# Patient Record
Sex: Male | Born: 1960 | Race: Black or African American | Hispanic: No | Marital: Single | Smoking: Never smoker
Health system: Southern US, Community
[De-identification: ages and names within clinical notes are randomized; demographics above are authoritative.]

## PROBLEM LIST (undated history)

## (undated) DIAGNOSIS — E119 Type 2 diabetes mellitus without complications: Secondary | ICD-10-CM

## (undated) DIAGNOSIS — I10 Essential (primary) hypertension: Secondary | ICD-10-CM

## (undated) DIAGNOSIS — F419 Anxiety disorder, unspecified: Secondary | ICD-10-CM

## (undated) HISTORY — PX: NO PAST SURGERIES: SHX2092

---

## 2018-12-08 ENCOUNTER — Ambulatory Visit: Payer: Self-pay | Admitting: Orthopedic Surgery

## 2018-12-14 NOTE — Progress Notes (Signed)
No Pharmacies Listed     Your procedure is scheduled on Thursday 12/17/2018.  Report to [facility] Main Entrance "A" at 0930 A.M., and check in at the Admitting office.  Call this number if you have problems the morning of surgery:  [phone]  Call [phone] if you have any questions prior to your surgery date Monday-Friday 8am-4pm    Remember:  Do not eat or drink after midnight the night before your surgery    Take these medicines the morning of surgery with A SIP OF WATER:  7 days prior to surgery STOP taking any Aspirin (unless otherwise instructed by your surgeon), Aleve, Naproxen, Ibuprofen, Motrin, Advil, Goody's, BC's, all herbal medications, fish oil, and all vitamins.    The Morning of Surgery  Do not wear jewelry.  Do not wear lotions, powders, colognes, or deodorant  Men may shave face and neck.  Do not bring valuables to the hospital.  [facility] is not responsible for any belongings or valuables.  IF you are a smoker, DO NOT Smoke 24 hours prior to surgery  IF you wear a CPAP at night please bring your mask, tubing, and machine the morning of surgery   Remember that you must have someone to transport you home after your surgery, and remain with you for 24 hours if you are discharged the same day.   Contacts, eyeglasses, hearing aids, dentures or bridgework may not be worn into surgery.    Leave your suitcase in the car.  After surgery it may be brought to your room.  For patients admitted to the hospital, discharge time will be determined by your treatment team.  Patients discharged the day of surgery will not be allowed to drive home.    Special instructions:   [place]- Preparing For Surgery  Before surgery, you can play an important role. Because skin is not sterile, your skin needs to be as free of germs as possible. You can reduce the number of germs on your skin by washing with CHG (chlorahexidine gluconate) Soap before surgery.  CHG is an  antiseptic cleaner which kills germs and bonds with the skin to continue killing germs even after washing.    Oral Hygiene is also important to reduce your risk of infection.  Remember - BRUSH YOUR TEETH THE MORNING OF SURGERY WITH YOUR REGULAR TOOTHPASTE  Please do not use if you have an allergy to CHG or antibacterial soaps. If your skin becomes reddened/irritated stop using the CHG.  Do not shave (including legs and underarms) for at least 48 hours prior to first CHG shower. It is OK to shave your face.  Please follow these instructions carefully.   1. Shower the NIGHT BEFORE SURGERY and the MORNING OF SURGERY with CHG Soap.   2. If you chose to wash your hair, wash your hair first as usual with your normal shampoo.  3. After you shampoo, rinse your hair and body thoroughly to remove the shampoo.  4. Use CHG as you would any other liquid soap. You can apply CHG directly to the skin and wash gently with a scrungie or a clean washcloth.   5. Apply the CHG Soap to your body ONLY FROM THE NECK DOWN.  Do not use on open wounds or open sores. Avoid contact with your eyes, ears, mouth and genitals (private parts). Wash Face and genitals (private parts)  with your normal soap.   6. Wash thoroughly, paying special attention to the area where your surgery will  be performed.  7. Thoroughly rinse your body with warm water from the neck down.  8. DO NOT shower/wash with your normal soap after using and rinsing off the CHG Soap.  9. Pat yourself dry with a CLEAN TOWEL.  10. Wear CLEAN PAJAMAS to bed the night before surgery, wear comfortable clothes the morning of surgery  11. Place CLEAN SHEETS on your bed the night of your first shower and DO NOT SLEEP WITH PETS.    Day of Surgery:   Please shower the morning of surgery with the CHG soap Do not apply any deodorants/lotions.  Please wear clean clothes to the hospital/surgery center.   Remember to brush your teeth WITH YOUR REGULAR  TOOTHPASTE.   Please read over the following fact sheets that you were given.

## 2018-12-15 ENCOUNTER — Other Ambulatory Visit (HOSPITAL_COMMUNITY)
Admission: RE | Admit: 2018-12-15 | Discharge: 2018-12-15 | Disposition: A | Discharge status: Home / Self Care | Payer: BC Managed Care – PPO | Source: Ambulatory Visit | Attending: Orthopedic Surgery | Admitting: Orthopedic Surgery

## 2018-12-15 ENCOUNTER — Inpatient Hospital Stay (HOSPITAL_COMMUNITY)
Admission: RE | Admit: 2018-12-15 | Discharge: 2018-12-15 | Disposition: A | Discharge status: Home / Self Care | Payer: Self-pay | Source: Ambulatory Visit

## 2018-12-15 DIAGNOSIS — Z01812 Encounter for preprocedural laboratory examination: Secondary | ICD-10-CM | POA: Diagnosis present

## 2018-12-15 DIAGNOSIS — Z20828 Contact with and (suspected) exposure to other viral communicable diseases: Secondary | ICD-10-CM | POA: Insufficient documentation

## 2018-12-15 DIAGNOSIS — M5416 Radiculopathy, lumbar region: Secondary | ICD-10-CM | POA: Diagnosis not present

## 2018-12-15 DIAGNOSIS — Q057 Lumbar spina bifida without hydrocephalus: Secondary | ICD-10-CM | POA: Diagnosis not present

## 2018-12-15 LAB — SARS CORONAVIRUS 2 (TAT 6-24 HRS): SARS Coronavirus 2: NEGATIVE

## 2018-12-15 NOTE — Progress Notes (Addendum)
Patient did not show up on time for PAT appointment.  When I called patient he said he was told to go to [facility] today.  He did go to his COVID testing appointment today,   His appointment has been rescheduled for tomorrow 12/16/2018 at 1300

## 2018-12-16 ENCOUNTER — Encounter (HOSPITAL_COMMUNITY)
Admission: RE | Admit: 2018-12-16 | Discharge: 2018-12-16 | Disposition: A | Discharge status: Home / Self Care | Payer: BC Managed Care – PPO | Source: Ambulatory Visit | Attending: Orthopedic Surgery | Admitting: Orthopedic Surgery

## 2018-12-16 ENCOUNTER — Other Ambulatory Visit: Payer: Self-pay

## 2018-12-16 ENCOUNTER — Ambulatory Visit (HOSPITAL_COMMUNITY)
Admission: RE | Admit: 2018-12-16 | Discharge: 2018-12-16 | Disposition: A | Discharge status: Home / Self Care | Payer: BC Managed Care – PPO | Source: Ambulatory Visit | Attending: Orthopedic Surgery | Admitting: Orthopedic Surgery

## 2018-12-16 ENCOUNTER — Encounter (HOSPITAL_COMMUNITY): Payer: Self-pay

## 2018-12-16 DIAGNOSIS — Z01818 Encounter for other preprocedural examination: Secondary | ICD-10-CM | POA: Insufficient documentation

## 2018-12-16 HISTORY — DX: Type 2 diabetes mellitus without complications: E11.9

## 2018-12-16 HISTORY — DX: Essential (primary) hypertension: I10

## 2018-12-16 HISTORY — DX: Anxiety disorder, unspecified: F41.9

## 2018-12-16 LAB — PROTIME-INR
INR: 1.1 (ref 0.8–1.2)
Prothrombin Time: 13.7 seconds (ref 11.4–15.2)

## 2018-12-16 LAB — BASIC METABOLIC PANEL
Anion gap: 10 (ref 5–15)
BUN: 19 mg/dL (ref 6–20)
CO2: 25 mmol/L (ref 22–32)
Calcium: 9.8 mg/dL (ref 8.9–10.3)
Chloride: 101 mmol/L (ref 98–111)
Creatinine, Ser: 1.85 mg/dL — ABNORMAL HIGH (ref 0.61–1.24)
GFR calc Af Amer: 46 mL/min — ABNORMAL LOW (ref >60)
GFR calc non Af Amer: 40 mL/min — ABNORMAL LOW (ref >60)
Glucose, Bld: 224 mg/dL — ABNORMAL HIGH (ref 70–99)
Potassium: 4.4 mmol/L (ref 3.5–5.1)
Sodium: 136 mmol/L (ref 135–145)

## 2018-12-16 LAB — SURGICAL PCR SCREEN
MRSA, PCR: NEGATIVE
Staphylococcus aureus: NEGATIVE

## 2018-12-16 LAB — CBC
HCT: 46.9 % (ref 39.0–52.0)
Hemoglobin: 15.6 g/dL (ref 13.0–17.0)
MCH: 28.6 pg (ref 26.0–34.0)
MCHC: 33.3 g/dL (ref 30.0–36.0)
MCV: 86.1 fL (ref 80.0–100.0)
Platelets: 226 10*3/uL (ref 150–400)
RBC: 5.45 MIL/uL (ref 4.22–5.81)
RDW: 11.9 % (ref 11.5–15.5)
WBC: 11.1 10*3/uL — ABNORMAL HIGH (ref 4.0–10.5)
nRBC: 0 % (ref 0.0–0.2)

## 2018-12-16 LAB — APTT: aPTT: 29 seconds (ref 24–36)

## 2018-12-16 LAB — GLUCOSE, CAPILLARY: Glucose-Capillary: 233 mg/dL — ABNORMAL HIGH (ref 70–99)

## 2018-12-17 ENCOUNTER — Ambulatory Visit (HOSPITAL_COMMUNITY): Payer: BC Managed Care – PPO

## 2018-12-17 ENCOUNTER — Ambulatory Visit (HOSPITAL_COMMUNITY): Payer: BC Managed Care – PPO | Admitting: Certified Registered Nurse Anesthetist

## 2018-12-17 ENCOUNTER — Observation Stay (HOSPITAL_COMMUNITY)
Admission: RE | Admit: 2018-12-17 | Discharge: 2018-12-18 | Disposition: A | Discharge status: Home / Self Care | Payer: BC Managed Care – PPO | Attending: Orthopedic Surgery | Admitting: Orthopedic Surgery

## 2018-12-17 ENCOUNTER — Other Ambulatory Visit: Payer: Self-pay

## 2018-12-17 ENCOUNTER — Ambulatory Visit (HOSPITAL_COMMUNITY): Payer: BC Managed Care – PPO | Admitting: Physician Assistant

## 2018-12-17 ENCOUNTER — Encounter (HOSPITAL_COMMUNITY): Payer: Self-pay | Admitting: Certified Registered Nurse Anesthetist

## 2018-12-17 ENCOUNTER — Encounter (HOSPITAL_COMMUNITY)
Admission: RE | Disposition: A | Discharge status: Home / Self Care | Payer: Self-pay | Source: Home / Self Care | Attending: Orthopedic Surgery

## 2018-12-17 DIAGNOSIS — I10 Essential (primary) hypertension: Secondary | ICD-10-CM | POA: Diagnosis not present

## 2018-12-17 DIAGNOSIS — Z79899 Other long term (current) drug therapy: Secondary | ICD-10-CM | POA: Diagnosis not present

## 2018-12-17 DIAGNOSIS — M47817 Spondylosis without myelopathy or radiculopathy, lumbosacral region: Secondary | ICD-10-CM | POA: Insufficient documentation

## 2018-12-17 DIAGNOSIS — Z6831 Body mass index (BMI) 31.0-31.9, adult: Secondary | ICD-10-CM | POA: Diagnosis not present

## 2018-12-17 DIAGNOSIS — Z419 Encounter for procedure for purposes other than remedying health state, unspecified: Secondary | ICD-10-CM

## 2018-12-17 DIAGNOSIS — Z794 Long term (current) use of insulin: Secondary | ICD-10-CM | POA: Insufficient documentation

## 2018-12-17 DIAGNOSIS — E119 Type 2 diabetes mellitus without complications: Secondary | ICD-10-CM | POA: Diagnosis not present

## 2018-12-17 DIAGNOSIS — Z7982 Long term (current) use of aspirin: Secondary | ICD-10-CM | POA: Insufficient documentation

## 2018-12-17 DIAGNOSIS — E669 Obesity, unspecified: Secondary | ICD-10-CM | POA: Diagnosis not present

## 2018-12-17 DIAGNOSIS — M5116 Intervertebral disc disorders with radiculopathy, lumbar region: Principal | ICD-10-CM | POA: Insufficient documentation

## 2018-12-17 DIAGNOSIS — M5126 Other intervertebral disc displacement, lumbar region: Secondary | ICD-10-CM | POA: Diagnosis present

## 2018-12-17 HISTORY — PX: LUMBAR LAMINECTOMY/DECOMPRESSION MICRODISCECTOMY: SHX5026

## 2018-12-17 LAB — GLUCOSE, CAPILLARY
Glucose-Capillary: 231 mg/dL — ABNORMAL HIGH (ref 70–99)
Glucose-Capillary: 198 mg/dL — ABNORMAL HIGH (ref 70–99)
Glucose-Capillary: 188 mg/dL — ABNORMAL HIGH (ref 70–99)
Glucose-Capillary: 184 mg/dL — ABNORMAL HIGH (ref 70–99)

## 2018-12-17 LAB — HEMOGLOBIN A1C
Hgb A1c MFr Bld: 7.8 % — ABNORMAL HIGH (ref 4.8–5.6)
Mean Plasma Glucose: 177.16 mg/dL

## 2018-12-17 SURGERY — LUMBAR LAMINECTOMY/DECOMPRESSION MICRODISCECTOMY 1 LEVEL
Anesthesia: General | Laterality: Right

## 2018-12-17 MED ORDER — INSULIN ASPART 100 UNIT/ML ~~LOC~~ SOLN
0.0000 [IU] | Freq: Every day | SUBCUTANEOUS | Status: DC
  Filled 2018-12-17: qty 0.05

## 2018-12-17 MED ORDER — THROMBIN 20000 UNITS EX SOLR
CUTANEOUS | Status: AC
  Filled 2018-12-17: qty 20000

## 2018-12-17 MED ORDER — PHENYLEPHRINE 40 MCG/ML (10ML) SYRINGE FOR IV PUSH (FOR BLOOD PRESSURE SUPPORT)
PREFILLED_SYRINGE | INTRAVENOUS | Status: DC | PRN
  Administered 2018-12-17: 80 ug via INTRAVENOUS
  Administered 2018-12-17 (×2): 120 ug via INTRAVENOUS
  Administered 2018-12-17: 80 ug via INTRAVENOUS

## 2018-12-17 MED ORDER — ROCURONIUM BROMIDE 10 MG/ML (PF) SYRINGE
PREFILLED_SYRINGE | INTRAVENOUS | Status: AC
  Filled 2018-12-17: qty 10

## 2018-12-17 MED ORDER — SODIUM CHLORIDE 0.9% FLUSH: 3.0000 mL | INTRAVENOUS | Status: DC | PRN

## 2018-12-17 MED ORDER — FENTANYL CITRATE (PF) 250 MCG/5ML IJ SOLN
INTRAMUSCULAR | Status: AC
  Filled 2018-12-17: qty 5

## 2018-12-17 MED ORDER — 0.9 % SODIUM CHLORIDE (POUR BTL) OPTIME
TOPICAL | Status: DC | PRN
  Administered 2018-12-17: 1000 mL

## 2018-12-17 MED ORDER — BUPIVACAINE-EPINEPHRINE 0.25% -1:200000 IJ SOLN
INTRAMUSCULAR | Status: DC | PRN
  Administered 2018-12-17: 10 mL

## 2018-12-17 MED ORDER — MORPHINE SULFATE (PF) 2 MG/ML IV SOLN
2.0000 mg | INTRAVENOUS | Status: DC | PRN
  Administered 2018-12-17: 19:00:00 2 mg via INTRAVENOUS
  Filled 2018-12-17: qty 1

## 2018-12-17 MED ORDER — SODIUM CHLORIDE 0.9 % IV SOLN: 250.0000 mL | INTRAVENOUS | Status: DC

## 2018-12-17 MED ORDER — FENTANYL CITRATE (PF) 100 MCG/2ML IJ SOLN
INTRAMUSCULAR | Status: DC | PRN
  Administered 2018-12-17: 100 ug via INTRAVENOUS
  Administered 2018-12-17: 50 ug via INTRAVENOUS

## 2018-12-17 MED ORDER — CEFAZOLIN SODIUM-DEXTROSE 2-3 GM-%(50ML) IV SOLR
INTRAVENOUS | Status: DC | PRN
  Administered 2018-12-17: 2 g via INTRAVENOUS

## 2018-12-17 MED ORDER — THROMBIN 20000 UNITS EX SOLR
CUTANEOUS | Status: DC | PRN
  Administered 2018-12-17: 20 mL via TOPICAL

## 2018-12-17 MED ORDER — INSULIN DETEMIR 100 UNIT/ML ~~LOC~~ SOLN
15.0000 [IU] | Freq: Every day | SUBCUTANEOUS | Status: DC
  Administered 2018-12-17 – 2018-12-18 (×3): 15 [IU] via SUBCUTANEOUS
  Filled 2018-12-17 (×2): qty 0.15

## 2018-12-17 MED ORDER — ONDANSETRON HCL 4 MG PO TABS: 4.0000 mg | ORAL_TABLET | Freq: Four times a day (QID) | ORAL | Status: DC | PRN

## 2018-12-17 MED ORDER — IRBESARTAN 300 MG PO TABS
300.0000 mg | ORAL_TABLET | Freq: Every day | ORAL | Status: DC
  Administered 2018-12-17 – 2018-12-18 (×2): 300 mg via ORAL
  Filled 2018-12-17 (×2): qty 1

## 2018-12-17 MED ORDER — METHOCARBAMOL 500 MG PO TABS
500.0000 mg | ORAL_TABLET | Freq: Four times a day (QID) | ORAL | Status: DC | PRN
  Administered 2018-12-17 – 2018-12-18 (×3): 500 mg via ORAL
  Filled 2018-12-17 (×3): qty 1

## 2018-12-17 MED ORDER — PROPOFOL 10 MG/ML IV BOLUS
INTRAVENOUS | Status: AC
  Filled 2018-12-17: qty 40

## 2018-12-17 MED ORDER — METHOCARBAMOL 500 MG PO TABS: 500.0000 mg | ORAL_TABLET | Freq: Three times a day (TID) | ORAL | 0 refills | Status: AC | PRN

## 2018-12-17 MED ORDER — MIDAZOLAM HCL 2 MG/2ML IJ SOLN
INTRAMUSCULAR | Status: AC
  Filled 2018-12-17: qty 2

## 2018-12-17 MED ORDER — AMLODIPINE BESYLATE 5 MG PO TABS
10.0000 mg | ORAL_TABLET | Freq: Every day | ORAL | Status: DC
  Administered 2018-12-17 – 2018-12-18 (×2): 10 mg via ORAL
  Filled 2018-12-17 (×2): qty 2

## 2018-12-17 MED ORDER — HYDROCHLOROTHIAZIDE 25 MG PO TABS
25.0000 mg | ORAL_TABLET | Freq: Every day | ORAL | Status: DC
  Administered 2018-12-17 – 2018-12-18 (×2): 25 mg via ORAL
  Filled 2018-12-17 (×2): qty 1

## 2018-12-17 MED ORDER — HEMOSTATIC AGENTS (NO CHARGE) OPTIME
TOPICAL | Status: DC | PRN
  Administered 2018-12-17: 1 via TOPICAL

## 2018-12-17 MED ORDER — OXYCODONE HCL 5 MG PO TABS
10.0000 mg | ORAL_TABLET | ORAL | Status: DC | PRN
  Administered 2018-12-17 – 2018-12-18 (×4): 10 mg via ORAL
  Filled 2018-12-17 (×4): qty 2

## 2018-12-17 MED ORDER — PHENYLEPHRINE 40 MCG/ML (10ML) SYRINGE FOR IV PUSH (FOR BLOOD PRESSURE SUPPORT)
PREFILLED_SYRINGE | INTRAVENOUS | Status: AC
  Filled 2018-12-17: qty 10

## 2018-12-17 MED ORDER — LACTATED RINGERS IV SOLN
INTRAVENOUS | Status: DC
  Administered 2018-12-17 (×2): via INTRAVENOUS

## 2018-12-17 MED ORDER — ONDANSETRON HCL 4 MG/2ML IJ SOLN: 4.0000 mg | Freq: Four times a day (QID) | INTRAMUSCULAR | Status: DC | PRN

## 2018-12-17 MED ORDER — INSULIN ASPART 100 UNIT/ML ~~LOC~~ SOLN
SUBCUTANEOUS | Status: AC
  Filled 2018-12-17: qty 1

## 2018-12-17 MED ORDER — ACETAMINOPHEN 10 MG/ML IV SOLN
INTRAVENOUS | Status: DC | PRN
  Administered 2018-12-17: 1000 mg via INTRAVENOUS

## 2018-12-17 MED ORDER — ACETAMINOPHEN 325 MG PO TABS
650.0000 mg | ORAL_TABLET | ORAL | Status: DC | PRN
  Administered 2018-12-17 – 2018-12-18 (×2): 650 mg via ORAL
  Filled 2018-12-17 (×2): qty 2

## 2018-12-17 MED ORDER — SODIUM CHLORIDE 0.9 % IV SOLN
INTRAVENOUS | Status: DC | PRN
  Administered 2018-12-17: 30 ug/min via INTRAVENOUS

## 2018-12-17 MED ORDER — ACETAMINOPHEN 10 MG/ML IV SOLN
INTRAVENOUS | Status: AC
  Filled 2018-12-17: qty 100

## 2018-12-17 MED ORDER — ONDANSETRON HCL 4 MG PO TABS: 4.0000 mg | ORAL_TABLET | Freq: Three times a day (TID) | ORAL | 0 refills | Status: AC | PRN

## 2018-12-17 MED ORDER — EPHEDRINE SULFATE-NACL 50-0.9 MG/10ML-% IV SOSY
PREFILLED_SYRINGE | INTRAVENOUS | Status: DC | PRN
  Administered 2018-12-17: 5 mg via INTRAVENOUS

## 2018-12-17 MED ORDER — LACTATED RINGERS IV SOLN: INTRAVENOUS | Status: DC

## 2018-12-17 MED ORDER — CEFAZOLIN SODIUM-DEXTROSE 1-4 GM/50ML-% IV SOLN
1.0000 g | Freq: Three times a day (TID) | INTRAVENOUS | Status: AC
  Administered 2018-12-17 (×2): 1 g via INTRAVENOUS
  Filled 2018-12-17 (×2): qty 50

## 2018-12-17 MED ORDER — ONDANSETRON HCL 4 MG/2ML IJ SOLN
INTRAMUSCULAR | Status: AC
  Filled 2018-12-17: qty 2

## 2018-12-17 MED ORDER — PHENOL 1.4 % MT LIQD: 1.0000 | OROMUCOSAL | Status: DC | PRN

## 2018-12-17 MED ORDER — INSULIN ASPART 100 UNIT/ML FLEXPEN: 100.0000 [IU] | PEN_INJECTOR | Freq: Three times a day (TID) | SUBCUTANEOUS | Status: DC

## 2018-12-17 MED ORDER — SODIUM CHLORIDE 0.9% FLUSH: 3.0000 mL | Freq: Two times a day (BID) | INTRAVENOUS | Status: DC

## 2018-12-17 MED ORDER — SUGAMMADEX SODIUM 200 MG/2ML IV SOLN
INTRAVENOUS | Status: DC | PRN
  Administered 2018-12-17: 200 mg via INTRAVENOUS

## 2018-12-17 MED ORDER — ROCURONIUM BROMIDE 100 MG/10ML IV SOLN
INTRAVENOUS | Status: DC | PRN
  Administered 2018-12-17: 10 mg via INTRAVENOUS
  Administered 2018-12-17: 20 mg via INTRAVENOUS
  Administered 2018-12-17: 60 mg via INTRAVENOUS

## 2018-12-17 MED ORDER — LIDOCAINE 2% (20 MG/ML) 5 ML SYRINGE
INTRAMUSCULAR | Status: AC
  Filled 2018-12-17: qty 5

## 2018-12-17 MED ORDER — OXYCODONE HCL 5 MG PO TABS: 5.0000 mg | ORAL_TABLET | ORAL | Status: DC | PRN

## 2018-12-17 MED ORDER — VALSARTAN-HYDROCHLOROTHIAZIDE 320-25 MG PO TABS: 1.0000 | ORAL_TABLET | Freq: Every day | ORAL | Status: DC

## 2018-12-17 MED ORDER — OXYCODONE-ACETAMINOPHEN 10-325 MG PO TABS: 1.0000 | ORAL_TABLET | Freq: Four times a day (QID) | ORAL | 0 refills | Status: AC | PRN

## 2018-12-17 MED ORDER — BUPIVACAINE-EPINEPHRINE (PF) 0.25% -1:200000 IJ SOLN
INTRAMUSCULAR | Status: AC
  Filled 2018-12-17: qty 10

## 2018-12-17 MED ORDER — MENTHOL 3 MG MT LOZG: 1.0000 | LOZENGE | OROMUCOSAL | Status: DC | PRN

## 2018-12-17 MED ORDER — BUPIVACAINE HCL (PF) 0.25 % IJ SOLN
INTRAMUSCULAR | Status: AC
  Filled 2018-12-17: qty 30

## 2018-12-17 MED ORDER — INSULIN ASPART 100 UNIT/ML ~~LOC~~ SOLN
0.0000 [IU] | Freq: Three times a day (TID) | SUBCUTANEOUS | Status: DC
  Administered 2018-12-17 (×2): 3 [IU] via SUBCUTANEOUS
  Administered 2018-12-18: 08:00:00 5 [IU] via SUBCUTANEOUS
  Filled 2018-12-17: qty 0.15

## 2018-12-17 MED ORDER — LIDOCAINE 2% (20 MG/ML) 5 ML SYRINGE
INTRAMUSCULAR | Status: DC | PRN
  Administered 2018-12-17: 100 mg via INTRAVENOUS

## 2018-12-17 MED ORDER — ACETAMINOPHEN 650 MG RE SUPP: 650.0000 mg | RECTAL | Status: DC | PRN

## 2018-12-17 MED ORDER — METHOCARBAMOL 1000 MG/10ML IJ SOLN
500.0000 mg | Freq: Four times a day (QID) | INTRAVENOUS | Status: DC | PRN
  Filled 2018-12-17: qty 5

## 2018-12-17 MED ORDER — ONDANSETRON HCL 4 MG/2ML IJ SOLN
INTRAMUSCULAR | Status: DC | PRN
  Administered 2018-12-17: 4 mg via INTRAVENOUS

## 2018-12-17 MED ORDER — GABAPENTIN 300 MG PO CAPS
300.0000 mg | ORAL_CAPSULE | Freq: Three times a day (TID) | ORAL | Status: DC
  Administered 2018-12-17 – 2018-12-18 (×3): 300 mg via ORAL
  Filled 2018-12-17 (×3): qty 1

## 2018-12-17 MED ORDER — MIDAZOLAM HCL 5 MG/5ML IJ SOLN
INTRAMUSCULAR | Status: DC | PRN
  Administered 2018-12-17: 2 mg via INTRAVENOUS

## 2018-12-17 MED ORDER — PROPOFOL 10 MG/ML IV BOLUS
INTRAVENOUS | Status: DC | PRN
  Administered 2018-12-17: 150 mg via INTRAVENOUS
  Administered 2018-12-17: 50 mg via INTRAVENOUS

## 2018-12-17 SURGICAL SUPPLY — 67 items
BNDG GAUZE ELAST 4 BULKY (GAUZE/BANDAGES/DRESSINGS) ×3 IMPLANT
BUR EGG ELITE 4.0 (BURR) IMPLANT
BUR EGG ELITE 4.0MM (BURR)
BUR MATCHSTICK NEURO 3.0 LAGG (BURR) IMPLANT
CANISTER SUCT 3000ML PPV (MISCELLANEOUS) ×3 IMPLANT
CLOSURE STERI-STRIP 1/2X4 (GAUZE/BANDAGES/DRESSINGS) ×1
CLOSURE WOUND 1/2 X4 (GAUZE/BANDAGES/DRESSINGS) ×1
CLSR STERI-STRIP ANTIMIC 1/2X4 (GAUZE/BANDAGES/DRESSINGS) ×2 IMPLANT
CORD BIPOLAR FORCEPS 12FT (ELECTRODE) ×3 IMPLANT
COVER SURGICAL LIGHT HANDLE (MISCELLANEOUS) ×1 IMPLANT
COVER WAND RF STERILE (DRAPES) ×3 IMPLANT
DRAIN CHANNEL 15F RND FF W/TCR (WOUND CARE) IMPLANT
DRAPE LAPAROTOMY 100X72X124 (DRAPES) ×2 IMPLANT
DRAPE POUCH INSTRU U-SHP 10X18 (DRAPES) ×3 IMPLANT
DRAPE SURG 17X23 STRL (DRAPES) ×3 IMPLANT
DRAPE U-SHAPE 47X51 STRL (DRAPES) ×3 IMPLANT
DRSG OPSITE POSTOP 3X4 (GAUZE/BANDAGES/DRESSINGS) ×3 IMPLANT
DRSG OPSITE POSTOP 4X6 (GAUZE/BANDAGES/DRESSINGS) ×2 IMPLANT
DURAPREP 26ML APPLICATOR (WOUND CARE) ×3 IMPLANT
ELECT BLADE 4.0 EZ CLEAN MEGAD (MISCELLANEOUS) ×3
ELECT CAUTERY BLADE 6.4 (BLADE) ×3 IMPLANT
ELECT PENCIL ROCKER SW 15FT (MISCELLANEOUS) ×1 IMPLANT
ELECT REM PT RETURN 9FT ADLT (ELECTROSURGICAL) ×3
ELECTRODE BLDE 4.0 EZ CLN MEGD (MISCELLANEOUS) IMPLANT
ELECTRODE REM PT RTRN 9FT ADLT (ELECTROSURGICAL) ×1 IMPLANT
EVACUATOR SILICONE 100CC (DRAIN) IMPLANT
GLOVE BIO SURGEON STRL SZ 6.5 (GLOVE) ×3 IMPLANT
GLOVE BIO SURGEONS STRL SZ 6.5 (GLOVE) ×2
GLOVE BIOGEL PI IND STRL 6.5 (GLOVE) ×1 IMPLANT
GLOVE BIOGEL PI IND STRL 8.5 (GLOVE) ×1 IMPLANT
GLOVE BIOGEL PI INDICATOR 6.5 (GLOVE) ×4
GLOVE BIOGEL PI INDICATOR 8.5 (GLOVE) ×2
GLOVE SS BIOGEL STRL SZ 8.5 (GLOVE) ×1 IMPLANT
GLOVE SUPERSENSE BIOGEL SZ 8.5 (GLOVE) ×2
GLOVE SURG SS PI 6.0 STRL IVOR (GLOVE) ×2 IMPLANT
GLOVE SURG SS PI 6.5 STRL IVOR (GLOVE) ×4 IMPLANT
GOWN STRL REUS W/ TWL LRG LVL3 (GOWN DISPOSABLE) ×2 IMPLANT
GOWN STRL REUS W/TWL 2XL LVL3 (GOWN DISPOSABLE) ×3 IMPLANT
GOWN STRL REUS W/TWL LRG LVL3 (GOWN DISPOSABLE) ×6
KIT BASIN OR (CUSTOM PROCEDURE TRAY) ×3 IMPLANT
KIT TURNOVER KIT B (KITS) ×3 IMPLANT
NDL SPNL 18GX3.5 QUINCKE PK (NEEDLE) ×2 IMPLANT
NEEDLE 22X1 1/2 (OR ONLY) (NEEDLE) ×3 IMPLANT
NEEDLE SPNL 18GX3.5 QUINCKE PK (NEEDLE) ×6 IMPLANT
NS IRRIG 1000ML POUR BTL (IV SOLUTION) ×3 IMPLANT
PACK LAMINECTOMY ORTHO (CUSTOM PROCEDURE TRAY) ×1 IMPLANT
PACK UNIVERSAL I (CUSTOM PROCEDURE TRAY) ×3 IMPLANT
PAD ARMBOARD 7.5X6 YLW CONV (MISCELLANEOUS) ×2 IMPLANT
PATTIES SURGICAL .5 X.5 (GAUZE/BANDAGES/DRESSINGS) ×3 IMPLANT
PATTIES SURGICAL .5 X1 (DISPOSABLE) ×3 IMPLANT
SPONGE SURGIFOAM ABS GEL 100 (HEMOSTASIS) ×2 IMPLANT
STRIP CLOSURE SKIN 1/2X4 (GAUZE/BANDAGES/DRESSINGS) ×1 IMPLANT
SURGIFLO W/THROMBIN 8M KIT (HEMOSTASIS) ×2 IMPLANT
SUT BONE WAX W31G (SUTURE) ×1 IMPLANT
SUT MON AB 3-0 SH 27 (SUTURE) ×2
SUT MON AB 3-0 SH27 (SUTURE) ×1 IMPLANT
SUT VIC AB 0 CT1 27 (SUTURE)
SUT VIC AB 0 CT1 27XBRD ANBCTR (SUTURE) IMPLANT
SUT VIC AB 1 CT1 18XCR BRD 8 (SUTURE) ×1 IMPLANT
SUT VIC AB 1 CT1 8-18 (SUTURE) ×2
SUT VIC AB 2-0 CT1 18 (SUTURE) ×3 IMPLANT
SYR BULB IRRIGATION 50ML (SYRINGE) ×3 IMPLANT
SYR CONTROL 10ML LL (SYRINGE) ×3 IMPLANT
TOWEL GREEN STERILE (TOWEL DISPOSABLE) ×3 IMPLANT
TOWEL GREEN STERILE FF (TOWEL DISPOSABLE) ×3 IMPLANT
WATER STERILE IRR 1000ML POUR (IV SOLUTION) ×3 IMPLANT
YANKAUER SUCT BULB TIP NO VENT (SUCTIONS) ×2 IMPLANT

## 2018-12-17 NOTE — Op Note (Signed)
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  2 fragments of disc material consistent with preoperative MRI imaging.  Indications: Donald Weeks is a very pleasant 58 year old gentleman who presented to my office with severe radicular right leg pain.  Imaging studies demonstrate a posterior lateral to the right foraminal disc herniation with neural compression.  Patient also had L4 radicular weakness and dysesthesias.  As result of the severe pain and neurological deficits we elected to move forward with surgery.  All appropriate risks benefits and alternatives were discussed and explained to the patient and consent was obtained.  Operative report.  Patient is brought the operating room placed upon the operating room table.  After successful induction of general anesthesia and endotracheal the patient teds SCDs were applied and he was turned prone onto the Wilson frame.  All bony prominences were well-padded and the back was prepped and draped in standard fashion.  Timeout was done to confirm patient procedure and all other important data.  2 needles were placed into the back and I took a lateral x-ray to confirm the L4-5 level.  Once identified this I then marked out my incision site approximately 1 fingerbreadth to the right of midline centered over the L4 foramen.  Incision was made and sharp dissection was carried out down to the deep fascia.  The deep fascia was sharply incised and I bluntly dissected through the pair of spinal muscle down to the L4-5 facet.  I was able to palpate the L4 pars as well.  I then docked my initial dilator for the Thompson tube system just over the L4  foramen.  A second intraoperative x-ray was taken and I confirmed that I was at the appropriate level.  I sequentially dilated and then placed my retractor blades to now expose the L4 foramen.  I then placed a Penfield 4 along the lateral aspect of the pars into the foramen and I took a final lateral fluoroscopy view confirming that I was at the L4 foramen.  Using a 2 and 3 mm Kerrison Roger I resected the medial third of the pars in order to visualize the extraforaminal and foraminal zone.  I then remove the facet capsule with a nerve hook and 2 mm Kerrison.  At this point I was able to visualize the large foraminal disc herniation.  Using a small nerve hook I gently began dissecting mobilizing the disc fragment.  Ultimately I was able to remove 2 large fragments of disc material.  The disc fragment was just superior to the L4-5 disc space and was consistent in size and location when compared to the preoperative MRI.  Once the large fragments were removed I obtained hemostasis using bipolar electrocautery.  I could now visualize the L4 nerve root in the foramen and easily palpate into the lateral recess superiorly and inferiorly.  The L4 nerve root was no longer compressed against the L4 pedicle.  I could directly visualize and freely mobilize the L4 nerve root.  I could also palpate into the lateral recess above the pedicle and inferiorly along the disc space level confirming satisfactory decompression and discectomy.  At this point using bipolar cautery and FloSeal I obtained and maintain hemostasis.  After final irrigation I placed a thrombin-soaked Gelfoam patty over the foraminotomy site  and then remove the self-retaining retractor.  I then closed the deep fascia in a layered fashion with interrupted #1 Vicryl suture, 2-0 Vicryl suture, and 3-0 Monocryl for the skin.  Steri-Strips dry dressing was applied and the patient was ultimately extubated transfer the PACU that incident.  The end of the case all  needle sponge counts were correct.  There were no adverse intraoperative events.

## 2018-12-17 NOTE — H&P (Signed)
History of Present Illness Donald Weeks is a pleasant 58 year old male with past medical history significant for hypertension, diabetes who unfortunately has suffered a L4 foraminal herniated disc causing right radicular leg pain, And right quad weakness. Given the severe pain, Weakness, and the size of the disc herniation, patient has elected to move forward with surgical intervention. He is scheduled for Right L4-5 Foraminal Discectomy on 12/17/18 At [facility] with Dr. Rolena Infante. We have obtained preoperative clearance from his primary care provider.  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  Lungs: CTAB  Abdomen: Normal bowel soundsX4, soft, non-tender, no hepatosplenomegaly. No rebound tenderness. No loss of bladder or bowel function.  ROM: -Spine: normal ROM, pain elicited with fwd flexion and extension. - Knee: flexion and extension normal and pain free bilaterally. - Ankle: Dorsiflexion, plantarflexion, inversion,  eversion normal and pain free.   Dermatomes: Lower extremity sensation to light touch abnormal with positive dysathesias on the right L4 dermatome  Myotomes:  - Hip Flexion: Left 5/5, Right 5/5 - Knee Extension: Left 5/5, Right 4/5 - Knee Flexion: Left 5/5, Right 5/5 - Ankle Dorsiflextion: Left 5/5, Right 5/5 - Ankle Plantarflexion: Left 5/5, Right 5/5  Reflexes:  - Patella: Left2+, Right 1+ - Achilles: Left2+, Right 2+ - Babinski: Left Ngative, Right Negative - Clonus: Negative  Special Tests: + Nerve tension test on right  PV: Extremities warm and well profused. Posterior and dorsalis pedis pulse 2+ bilaterally, No pitting Edema, discoloration, calf tenderness, or palpable cords.   MRI of the lumbar spine: Large right foraminal/extraforaminal disc extrusion at L4-5 with marked compression of the exiting L4 nerve root. L3-4 there is left-sided disc protrusion with some lateral recess stenosis. There is some right sided lateral recess stenosis but no significant compression or deformity of the traversing L4 nerve root at that level. Mild degenerative changes L5-S1 and L2-3 as well.  Assessment & Plan Diagnosis: Donald Weeks is a very pleasant 58 year old gentleman with a 2 month history of severe debilitating neuropathic right leg pain. Patient's clinical exam demonstrates numbness, weakness, and positive radiculopathy (nerve root tension sign). Imaging studies confirm L4 neural compression to a large right foraminal disc herniation.  At this point time given the fact he has neurological deficits and his pain is quite severe have recommended moving forward with surgery. Given the size of the disc herniation the likelihood of injection therapy being effective is quite low. I have gone over the surgery in great detail with him and he has expressed an understanding of the procedure as well as the risks and benefits. Risks and benefits of surgery were discussed with the patient. These include:  Infection, bleeding, death, stroke, paralysis, ongoing or worse pain, need for additional surgery, leak  of spinal fluid, adjacent segment degeneration requiring additional surgery, post-operative hematoma formation that can result in neurological compromise and the need for urgent/emergent re-operation. Loss in bowel and bladder control. Injury to major vessels that could result in the need for urgent abdominal surgery to stop bleeding. Risk of deep venous thrombosis (DVT) and the need for additional treatment. Recurrent disc herniation resulting in the need for revision surgery, which could include fusion surgery (utilizing instrumentation such as pedicle screws and intervertebral cages). In addition there is a risk of injury to the dorsal root ganglion which could result in a complex regional pain syndrome. Additional risk: If instrumentation is used there is a risk of migration, or breakage of that hardware that could require additional surgery.  We have also discussed the goals of surgery to include: Goals of surgery: Reduction in pain, and improvement in quality of life.  We have also discussed the post-operative recovery period to include: bathing/showering restrictions, wound healing, activity (and driving) restrictions, medications/pain mangement.  We have also discussed post-operative redflags to include: signs and symptoms of postoperative infection, DVT/PE.  Patient was given a LSO brace at today's visit.  We have obtained preoperative clearance from the patient's primary care provider

## 2018-12-17 NOTE — Brief Op Note (Signed)
12/17/2018  1:41 PM  PATIENT:  Donald Weeks  58 y.o. male  PRE-OPERATIVE DIAGNOSIS:  Right Lumbar four foraminal herniated disc with radiculopathy  POST-OPERATIVE DIAGNOSIS:  Right Lumbar four foraminal herniated disc with radiculopathy  PROCEDURE:  Procedure(s): Right Lumbar four-five  foraminal disectomy (Right)  SURGEON:  Surgeon(s) and Role:    Melina Schools, MD - Primary  PHYSICIAN ASSISTANT:   ASSISTANTS: Amanda Ward, PA   ANESTHESIA:   general  EBL:  50 mL   BLOOD ADMINISTERED:none  DRAINS: none   LOCAL MEDICATIONS USED:  MARCAINE     SPECIMEN:  No Specimen  DISPOSITION OF SPECIMEN:  N/A  COUNTS:  YES  TOURNIQUET:  * No tourniquets in log *  DICTATION: .Dragon Dictation  PLAN OF CARE: Admit for overnight observation  PATIENT DISPOSITION:  PACU - hemodynamically stable.

## 2018-12-17 NOTE — Transfer of Care (Signed)
Immediate Anesthesia Transfer of Care Note  Patient: Donald Weeks  Procedure(s) Performed: Right Lumbar four-five  foraminal disectomy (Right )  Patient Location: PACU  Anesthesia Type:General  Level of Consciousness: awake, alert  and oriented  Airway & Oxygen Therapy: Patient Spontanous Breathing and Patient connected to face mask oxygen  Post-op Assessment: Report given to RN and Post -op Vital signs reviewed and stable  Post vital signs: Reviewed and stable  Last Vitals:  Vitals Value Taken Time  BP    Temp    Pulse 78 12/17/18 1409  Resp 15 12/17/18 1409  SpO2 96 % 12/17/18 1409  Vitals shown include unvalidated device data.  Last Pain:  Vitals:   12/17/18 1022  TempSrc:   PainSc: 0-No pain         Complications: No apparent anesthesia complications

## 2018-12-17 NOTE — Discharge Instructions (Signed)

## 2018-12-17 NOTE — Anesthesia Procedure Notes (Signed)
Procedure Name: Intubation Date/Time: 12/17/2018 11:42 AM Performed by: Candis Shine, CRNA Pre-anesthesia Checklist: Patient identified, Emergency Drugs available, Suction available and Patient being monitored Patient Re-evaluated:Patient Re-evaluated prior to induction Oxygen Delivery Method: Circle System Utilized Preoxygenation: Pre-oxygenation with 100% oxygen Induction Type: IV induction Ventilation: Mask ventilation without difficulty and Oral airway inserted - appropriate to patient size Laryngoscope Size: Mac and 4 Grade View: Grade III Tube type: Oral Tube size: 7.5 mm Number of attempts: 1 Airway Equipment and Method: Oral airway and Bougie stylet Placement Confirmation: ETT inserted through vocal cords under direct vision,  positive ETCO2 and breath sounds checked- equal and bilateral Secured at: 23 cm Tube secured with: Tape Dental Injury: Teeth and Oropharynx as per pre-operative assessment  Difficulty Due To: Difficulty was unanticipated and Difficult Airway- due to anterior larynx Future Recommendations: Recommend- induction with short-acting agent, and alternative techniques readily available

## 2018-12-17 NOTE — Anesthesia Preprocedure Evaluation (Addendum)
Anesthesia Evaluation  Patient identified by MRN, date of birth, ID band Patient awake    Reviewed: Allergy & Precautions, NPO status , Patient's Chart, lab work & pertinent test results  Airway Mallampati: III  TM Distance: >3 FB Neck ROM: Full    Dental  (+) Teeth Intact, Dental Advisory Given   Pulmonary neg pulmonary ROS,    Pulmonary exam normal breath sounds clear to auscultation       Cardiovascular hypertension, Pt. on medications Normal cardiovascular exam Rhythm:Regular Rate:Normal     Neuro/Psych PSYCHIATRIC DISORDERS Anxiety Right L4 foraminal herniated disc with radiculopathy    GI/Hepatic negative GI ROS, Neg liver ROS,   Endo/Other  diabetes, Type 2, Insulin DependentObesity   Renal/GU Renal InsufficiencyRenal disease     Musculoskeletal negative musculoskeletal ROS (+)   Abdominal   Peds  Hematology negative hematology ROS (+)   Anesthesia Other Findings Day of surgery medications reviewed with the patient.  Reproductive/Obstetrics                           Anesthesia Physical Anesthesia Plan  ASA: II  Anesthesia Plan: General   Post-op Pain Management:    Induction: Intravenous  PONV Risk Score and Plan: 3 and Midazolam, Ondansetron and Dexamethasone  Airway Management Planned: Oral ETT  Additional Equipment:   Intra-op Plan:   Post-operative Plan: Extubation in OR  Informed Consent: I have reviewed the patients History and Physical, chart, labs and discussed the procedure including the risks, benefits and alternatives for the proposed anesthesia with the patient or authorized representative who has indicated his/her understanding and acceptance.     Dental advisory given  Plan Discussed with: CRNA  Anesthesia Plan Comments:         Anesthesia Quick Evaluation

## 2018-12-18 ENCOUNTER — Encounter (HOSPITAL_COMMUNITY): Payer: Self-pay | Admitting: Orthopedic Surgery

## 2018-12-18 DIAGNOSIS — M5116 Intervertebral disc disorders with radiculopathy, lumbar region: Secondary | ICD-10-CM | POA: Diagnosis not present

## 2018-12-18 LAB — GLUCOSE, CAPILLARY
Glucose-Capillary: 191 mg/dL — ABNORMAL HIGH (ref 70–99)
Glucose-Capillary: 201 mg/dL — ABNORMAL HIGH (ref 70–99)

## 2018-12-18 NOTE — Progress Notes (Signed)
Patient is discharged from room 3C02 at this time. Alert and in stable condition. IV site d/c'd and instructions read to patient and spouse with understanding verbalized. Left unit via wheelchair with all belongings at side 

## 2018-12-18 NOTE — Progress Notes (Signed)
    Subjective: Procedure(s) (LRB): Right Lumbar four-five  foraminal disectomy (Right) 1 Day Post-Op  Patient reports pain as 2 on 0-10 scale.  Reports decreased leg pain reports incisional back pain   Positive void Negative bowel movement Positive flatus Negative chest pain or shortness of breath  Objective: Vital signs in last 24 hours: Temp:  [98 F (36.7 C)-99.2 F (37.3 C)] 99.2 F (37.3 C) (08/28 0354) Pulse Rate:  [63-88] 88 (08/28 0354) Resp:  [14-20] 20 (08/28 0354) BP: (93-132)/(67-84) 132/84 (08/28 0354) SpO2:  [92 %-99 %] 92 % (08/28 0354) Weight:  [103.5 kg] 103.5 kg (08/27 0938)  Intake/Output from previous day: 08/27 0701 - 08/28 0700 In: 1490 [P.O.:240; I.V.:1200; IV Piggyback:50] Out: 50 [Blood:50]  Labs: Recent Labs    12/16/18 1324  WBC 11.1*  RBC 5.45  HCT 46.9  PLT 226   Recent Labs    12/16/18 1324  NA 136  K 4.4  CL 101  CO2 25  BUN 19  CREATININE 1.85*  GLUCOSE 224*  CALCIUM 9.8   Recent Labs    12/16/18 1324  INR 1.1    Physical Exam: Neurologically intact Intact pulses distally Incision: dressing C/D/I and no drainage Compartment soft Body mass index is 31.83 kg/m.  Assessment/Plan: Patient stable  xrays n/a Continue mobilization with physical therapy Continue care  Advance diet Up with therapy  1. Radicular leg pain improved.  Patient complains primarily of back and lateral right hip pain.  This is to be expected from surgical approach. 2. Patient will work with therapy today. 3. Plan on d/c to home later today if cleared by PT.  F/U in 2 weeks  Melina Schools, MD [facility] [phone]

## 2018-12-18 NOTE — Evaluation (Signed)
Occupational Therapy Evaluation Patient Details Name: Donald Weeks MRN: [id number] DOB: Mar 23, 1961 Today's Date: 12/18/2018    History of Present Illness Pt is a 58 year old man admitted 12/17/18 for L4-5 foraminal discectomy. PMH: DM, HTN, anxiety, renal insufficiency.   Clinical Impression   Pt educated in back precautions, compensatory strategies for ADL and use of AE for LB ADL. Verbalized and/or demonstrated understanding and recited 3/3 precautions at end of session. No further OT needs.    Follow Up Recommendations  No OT follow up    Equipment Recommendations  None recommended by OT    Recommendations for Other Services       Precautions / Restrictions Precautions Precautions: Back;Fall Precaution Booklet Issued: Yes (comment) Precaution Comments: reviewed back precaution with pt, able to verbalize at end of session Required Braces or Orthoses: Spinal Brace Spinal Brace: Lumbar corset;Applied in sitting position Restrictions Weight Bearing Restrictions: No      Mobility Bed Mobility Overal bed mobility: Needs Assistance Bed Mobility: Rolling;Sidelying to Sit Rolling: Supervision Sidelying to sit: Supervision       General bed mobility comments: used log roll technique  Transfers Overall transfer level: Needs assistance Equipment used: Rolling walker (2 wheeled) Transfers: Sit to/from Stand Sit to Stand: Supervision         General transfer comment: cues for hand placement, supervision for safety    Balance                                           ADL either performed or assessed with clinical judgement   ADL Overall ADL's : Needs assistance/impaired Eating/Feeding: Independent;Sitting   Grooming: Supervision/safety;Standing Grooming Details (indicate cue type and reason): instructed in compensatory strategies for toothbrushing, face washing Upper Body Bathing: Set up;Sitting;With adaptive equipment   Lower Body  Bathing: Supervison/ safety;Sit to/from stand;With adaptive equipment   Upper Body Dressing : Set up;Sitting   Lower Body Dressing: Minimal assistance;Sitting/lateral leans;With adaptive equipment   Toilet Transfer: Supervision/safety;Ambulation;RW     Toileting - Clothing Manipulation Details (indicate cue type and reason): instructed to avoid twisting with pericare, use of tongs as needed     Functional mobility during ADLs: Supervision/safety;Rolling walker       Vision Baseline Vision/History: No visual deficits Patient Visual Report: No change from baseline       Perception     Praxis      Pertinent Vitals/Pain Pain Assessment: Faces Faces Pain Scale: Hurts a little bit Pain Location: incision Pain Descriptors / Indicators: Sore Pain Intervention(s): Monitored during session     Hand Dominance Right   Extremity/Trunk Assessment Upper Extremity Assessment Upper Extremity Assessment: Overall WFL for tasks assessed   Lower Extremity Assessment Lower Extremity Assessment: Defer to PT evaluation   Cervical / Trunk Assessment Cervical / Trunk Assessment: Other exceptions Cervical / Trunk Exceptions: s/p back sx   Communication Communication Communication: No difficulties   Cognition Arousal/Alertness: Awake/alert Behavior During Therapy: WFL for tasks assessed/performed Overall Cognitive Status: Within Functional Limits for tasks assessed                                     General Comments       Exercises     Shoulder Instructions      Home Living Family/patient expects to be discharged to::  Private residence Living Arrangements: Spouse/significant other Available Help at Discharge: Friend(s);Available PRN/intermittently(girlfriend) Type of Home: House Home Access: Level entry     Home Layout: Two level;Able to live on main level with bedroom/bathroom     Bathroom Shower/Tub: Teacher, early years/pre: Handicapped  height     Home Equipment: Shower seat;Grab bars - tub/shower;Crutches          Prior Functioning/Environment Level of Independence: Independent with assistive device(s)        Comments: was walking with a single crutch        OT Problem List:        OT Treatment/Interventions:      OT Goals(Current goals can be found in the care plan section) Acute Rehab OT Goals Patient Stated Goal: return to PLOF  OT Frequency:     Barriers to D/C:            Co-evaluation              AM-PAC OT "6 Clicks" Daily Activity     Outcome Measure Help from another person eating meals?: None Help from another person taking care of personal grooming?: None Help from another person toileting, which includes using toliet, bedpan, or urinal?: None Help from another person bathing (including washing, rinsing, drying)?: None Help from another person to put on and taking off regular upper body clothing?: None Help from another person to put on and taking off regular lower body clothing?: A Little 6 Click Score: 23   End of Session Equipment Utilized During Treatment: Gait belt;Rolling walker;Back brace  Activity Tolerance: Patient tolerated treatment well Patient left: in chair;with call bell/phone within reach  OT Visit Diagnosis: Pain;Other abnormalities of gait and mobility (R26.89)                Time: 6979-4801 OT Time Calculation (min): 30 min Charges:  OT General Charges $OT Visit: 1 Visit OT Evaluation $OT Eval Moderate Complexity: 1 Mod OT Treatments $Self Care/Home Management : 8-22 mins  Nestor Lewandowsky, OTR/L Acute Rehabilitation Services Pager: [phone] Office: [phone]  Malka So 12/18/2018, 9:54 AM

## 2018-12-18 NOTE — Evaluation (Signed)
Physical Therapy Evaluation Patient Details Name: Donald Weeks MRN: [id number] DOB: March 09, 1961 Today's Date: 12/18/2018   History of Present Illness  Pt is a 58 year old man admitted 12/17/18 for L4-5 foraminal discectomy. PMH: DM, HTN, anxiety, renal insufficiency.  Clinical Impression  Pt admitted with above diagnosis. At the time of PT eval pt was able to perform transfers and ambulation with gross supervision for safety and min guard assist for stair negotiation. Pt was educated on precautions, brace application/wearing schedule, activity progression and car transfer. Pt currently with functional limitations due to the deficits listed below (see PT Problem List). Pt will benefit from skilled PT to increase their independence and safety with mobility to allow discharge to the venue listed below.       Follow Up Recommendations No PT follow up;Supervision for mobility/OOB    Equipment Recommendations  Rolling walker with 5" wheels    Recommendations for Other Services       Precautions / Restrictions Precautions Precautions: Back;Fall Precaution Booklet Issued: Yes (comment) Precaution Comments: reviewed back precaution with pt, able to verbalize at end of session Required Braces or Orthoses: Spinal Brace Spinal Brace: Lumbar corset;Applied in sitting position Restrictions Weight Bearing Restrictions: No      Mobility  Bed Mobility               General bed mobility comments: Pt was received sitting up in the recliner. Pt was educated on proper log roll technique verbally  Transfers Overall transfer level: Needs assistance Equipment used: Rolling walker (2 wheeled) Transfers: Sit to/from Stand Sit to Stand: Supervision         General transfer comment: Light supervision for safety. Pt demonstrated proper hand placement on seated surface for safety.   Ambulation/Gait Ambulation/Gait assistance: Supervision Gait Distance (Feet): 350 Feet Assistive device:  Rolling walker (2 wheeled) Gait Pattern/deviations: Step-through pattern;Decreased stride length;Trunk flexed Gait velocity: Decreased Gait velocity interpretation: 1.31 - 2.62 ft/sec, indicative of limited community ambulator General Gait Details: VC's for improved posture and closer walker proximity. Pt was able to ambulate well in hall without overt LOB.   Stairs Stairs: Yes Stairs assistance: Min guard Stair Management: One rail Right;Step to pattern;Forwards Number of Stairs: 3 General stair comments: VC's for sequencing and general safety.   Wheelchair Mobility    Modified Rankin (Stroke Patients Only)       Balance Overall balance assessment: Needs assistance Sitting-balance support: No upper extremity supported;Feet supported Sitting balance-Leahy Scale: Fair     Standing balance support: Bilateral upper extremity supported;During functional activity Standing balance-Leahy Scale: Poor Standing balance comment: Reliant on UE support at times                             Pertinent Vitals/Pain Pain Assessment: Faces Faces Pain Scale: Hurts a little bit Pain Location: incision Pain Descriptors / Indicators: Sore Pain Intervention(s): Monitored during session    Home Living Family/patient expects to be discharged to:: Private residence Living Arrangements: Spouse/significant other Available Help at Discharge: Friend(s);Available PRN/intermittently(girlfriend) Type of Home: House Home Access: Level entry     Home Layout: Two level;Able to live on main level with bedroom/bathroom Home Equipment: Shower seat;Grab bars - tub/shower;Crutches      Prior Function Level of Independence: Independent with assistive device(s)         Comments: was walking with a single crutch     Hand Dominance   Dominant Hand: Right  Extremity/Trunk Assessment   Upper Extremity Assessment Upper Extremity Assessment: Defer to OT evaluation    Lower Extremity  Assessment Lower Extremity Assessment: Generalized weakness(Consistent with pre-op diagnosis)    Cervical / Trunk Assessment Cervical / Trunk Assessment: Other exceptions Cervical / Trunk Exceptions: s/p back sx  Communication   Communication: No difficulties  Cognition Arousal/Alertness: Awake/alert Behavior During Therapy: WFL for tasks assessed/performed Overall Cognitive Status: Within Functional Limits for tasks assessed                                        General Comments      Exercises     Assessment/Plan    PT Assessment Patient needs continued PT services  PT Problem List Decreased strength;Decreased activity tolerance;Decreased balance;Decreased mobility;Decreased knowledge of use of DME;Decreased safety awareness;Decreased knowledge of precautions;Pain       PT Treatment Interventions DME instruction;Gait training;Stair training;Functional mobility training;Therapeutic activities;Therapeutic exercise;Neuromuscular re-education;Patient/family education    PT Goals (Current goals can be found in the Care Plan section)  Acute Rehab PT Goals Patient Stated Goal: return to PLOF PT Goal Formulation: With patient Time For Goal Achievement: 12/25/18 Potential to Achieve Goals: Good    Frequency Min 5X/week   Barriers to discharge        Co-evaluation               AM-PAC PT "6 Clicks" Mobility  Outcome Measure Help needed turning from your back to your side while in a flat bed without using bedrails?: None Help needed moving from lying on your back to sitting on the side of a flat bed without using bedrails?: None Help needed moving to and from a bed to a chair (including a wheelchair)?: A Little Help needed standing up from a chair using your arms (e.g., wheelchair or bedside chair)?: A Little Help needed to walk in hospital room?: A Little Help needed climbing 3-5 steps with a railing? : A Little 6 Click Score: 20    End of Session  Equipment Utilized During Treatment: Gait belt Activity Tolerance: Patient tolerated treatment well Patient left: in chair;with call bell/phone within reach Nurse Communication: Mobility status PT Visit Diagnosis: Unsteadiness on feet (R26.81);Pain;Other symptoms and signs involving the nervous system (R29.898) Pain - part of body: (back)    Time: 0950-1006 PT Time Calculation (min) (ACUTE ONLY): 16 min   Charges:   PT Evaluation $PT Eval Moderate Complexity: 1 Mod          Rolinda Roan, PT, DPT Acute Rehabilitation Services Pager: [phone] Office: [phone]   Thelma Comp 12/18/2018, 2:18 PM

## 2018-12-18 NOTE — Anesthesia Postprocedure Evaluation (Signed)
Anesthesia Post Note  Patient: Donald Weeks  Procedure(s) Performed: Right Lumbar four-five  foraminal disectomy (Right )     Patient location during evaluation: PACU Anesthesia Type: General Level of consciousness: awake and alert Pain management: pain level controlled Vital Signs Assessment: post-procedure vital signs reviewed and stable Respiratory status: spontaneous breathing, nonlabored ventilation, respiratory function stable and patient connected to nasal cannula oxygen Cardiovascular status: blood pressure returned to baseline and stable Postop Assessment: no apparent nausea or vomiting Anesthetic complications: no    Last Vitals:  Vitals:   12/18/18 0354 12/18/18 0739  BP: 132/84 104/72  Pulse: 88 93  Resp: 20 16  Temp: 37.3 C 36.8 C  SpO2: 92% 97%    Last Pain:  Vitals:   12/18/18 0927  TempSrc:   PainSc: 5                  Catalina Gravel

## 2018-12-23 NOTE — Discharge Summary (Signed)
Patient ID: Donald Weeks MRN: [id number] DOB/AGE: [birth year] 58 y.o.  Admit date: 12/17/2018 Discharge date: 12/23/2018  Admission Diagnoses:  Active Problems:   Lumbar disc herniation   Discharge Diagnoses:  Active Problems:   Lumbar disc herniation  status post Procedure(s): Right Lumbar four-five  foraminal disectomy  Past Medical History:  Diagnosis Date  . Anxiety   . Diabetes mellitus without complication ([place])   . Hypertension     Surgeries: Procedure(s): Right Lumbar four-five  foraminal disectomy on 12/17/2018   Consultants:   Discharged Condition: Improved  Hospital Course: Donald Weeks is an 58 y.o. male who was admitted 12/17/2018 for operative treatment ofL4 foraminal herniated disc causing right radicular leg pain . Patient failed conservative treatments (please see the history and physical for the specifics) and had severe unremitting pain that affects sleep, daily activities and work/hobbies. After pre-op clearance, the patient was taken to the operating room on 12/17/2018 and underwent  Procedure(s): Right Lumbar four-five  foraminal disectomy.    Patient was given perioperative antibiotics:  Anti-infectives (From admission, onward)   Start     Dose/Rate Route Frequency Ordered Stop   12/17/18 1530  ceFAZolin (ANCEF) IVPB 1 g/50 mL premix     1 g 100 mL/hr over 30 Minutes Intravenous Every 8 hours 12/17/18 1516 12/18/18 0805       Patient was given sequential compression devices and early ambulation to prevent DVT.   Patient benefited maximally from hospital stay and there were no complications. At the time of discharge, the patient was urinating/moving their bowels without difficulty, tolerating a regular diet, pain is controlled with oral pain medications and they have been cleared by PT/OT.   Recent vital signs: No data found.   Recent laboratory studies: No results for input(s): WBC, HGB, HCT, PLT, NA, K, CL, CO2, BUN, CREATININE,  GLUCOSE, INR, CALCIUM in the last 72 hours.  Invalid input(s): PT, 2   Discharge Medications:   Allergies as of 12/18/2018   Not on File     Medication List    STOP taking these medications   aspirin EC 81 MG tablet   gabapentin 300 MG capsule Commonly known as: NEURONTIN     TAKE these medications   amLODipine 10 MG tablet Commonly known as: NORVASC Take 10 mg by mouth daily.   insulin aspart 100 UNIT/ML FlexPen Commonly known as: NOVOLOG Inject into the skin 3 (three) times daily with meals.   insulin detemir 100 UNIT/ML injection Commonly known as: LEVEMIR Inject 15 Units into the skin daily.   ondansetron 4 MG tablet Commonly known as: Zofran Take 1 tablet (4 mg total) by mouth every 8 (eight) hours as needed for nausea or vomiting.   valsartan-hydrochlorothiazide 320-25 MG tablet Commonly known as: DIOVAN-HCT Take 1 tablet by mouth daily.     ASK your doctor about these medications   methocarbamol 500 MG tablet Commonly known as: Robaxin Take 1 tablet (500 mg total) by mouth every 8 (eight) hours as needed for up to 5 days for muscle spasms. Ask about: Should I take this medication?   oxyCODONE-acetaminophen 10-325 MG tablet Commonly known as: Percocet Take 1 tablet by mouth every 6 (six) hours as needed for up to 5 days for pain. Ask about: Should I take this medication?       Diagnostic Studies: Dg Chest 2 View  Result Date: 12/16/2018 CLINICAL DATA:  Preop back surgery hypertension and diabetes EXAM: CHEST - 2 VIEW COMPARISON:  07/21/2013 FINDINGS:  The heart size and mediastinal contours are within normal limits. Both lungs are clear. The visualized skeletal structures are unremarkable. IMPRESSION: No active cardiopulmonary disease. Electronically Signed   By: Jasmine PangKim  Fujinaga M.D.   On: 12/16/2018 20:11   Dg Lumbar Spine 1 View  Result Date: 12/17/2018 CLINICAL DATA:  58 year old male undergoing lumbar surgery. EXAM: LUMBAR SPINE - 1 VIEW; DG C-ARM  1-60 MIN [facility]PAR[facility]:  [facility] 10/02/2018. FINDINGS: Two intraoperative lateral fluoroscopic spot views of the lower lumbar spine. Normal lumbar segmentation on the comparison. These images demonstrate surgical probes at the posterior L4-L5 level. FLUOROSCOPY TIME:  0 minutes 14 seconds IMPRESSION: Intraoperative localization at L4-L5. Electronically Signed   By: Odessa FlemingH  Hall M.D.   On: 12/17/2018 14:28   Dg C-arm 1-60 Min  Result Date: 12/17/2018 CLINICAL DATA:  58 year old male undergoing lumbar surgery. EXAM: LUMBAR SPINE - 1 VIEW; DG C-ARM 1-60 MIN [facility]  [facility]/2018. FINDINGS: Two intraoperative lateral fluoroscopic spot views of the lower lumbar spine. Normal lumbar segmentation on the comparison. These images demonstrate surgical probes at the posterior L4-L5 level. FLUOROSCOPY TIME:  0 minutes 14 seconds IMPRESSION: Intraoperative localization at L4-L5. Electronically Signed   By: Odessa FlemingH  Hall M.D.   On: 12/17/2018 14:28    Discharge Instructions    Incentive spirometry RT   Complete by: As directed       Follow-up Information    Venita LickBrooks, Dahari, MD. Schedule an appointment as soon as possible for a visit in 2 weeks.   Specialty: Orthopedic Surgery Why: If symptoms worsen, For suture removal, For wound re-check Contact information: [address] [place] [address] [phone]           Discharge Plan:  discharge to home  Disposition: stable    Signed: Leonette MonarchAmanda  N Ward for [facility] PA-C [facility] [phone] 12/23/2018, 8:30 AM

## 2021-03-07 IMAGING — RF LUMBAR SPINE - 1 VIEW
1 series · 3 of 3 positions shown · non-contrast
Comparison: [HOSPITAL] [facility] lumbar radiographs 10/02/2018.

CLINICAL DATA: 57-year-old male undergoing lumbar surgery.

EXAM:
LUMBAR SPINE - 1 VIEW; DG C-ARM 1-60 MIN

[Series 1: run · 3 of 3 slices shown]
[im 1/3]
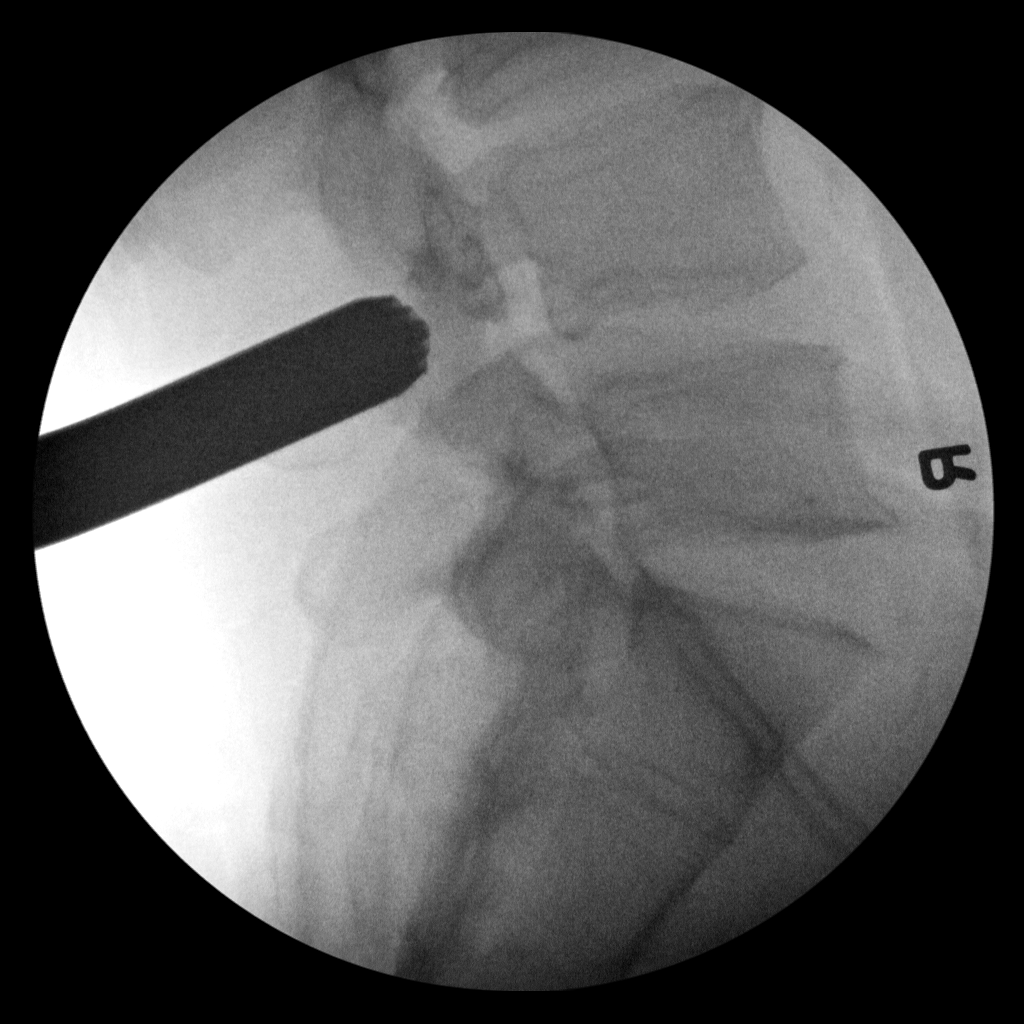
[im 2/3]
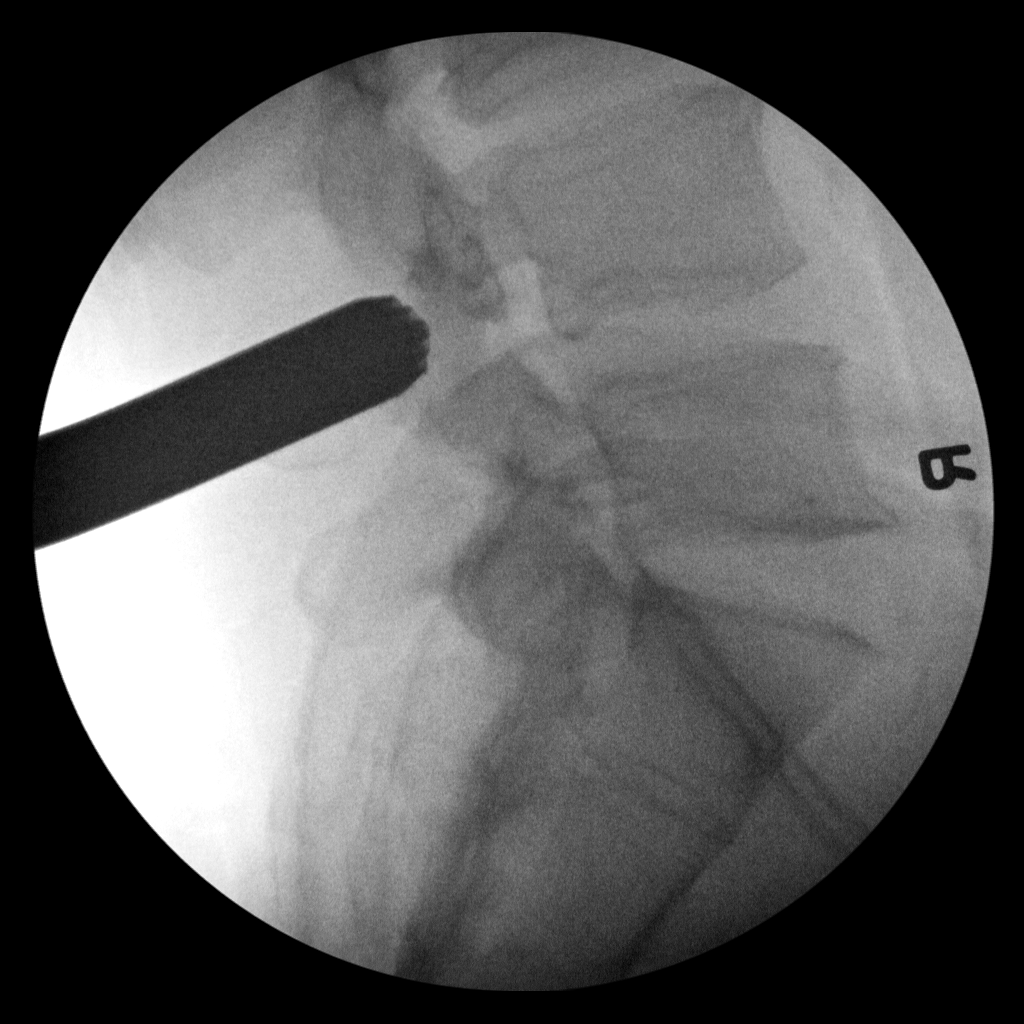
[im 3/3]
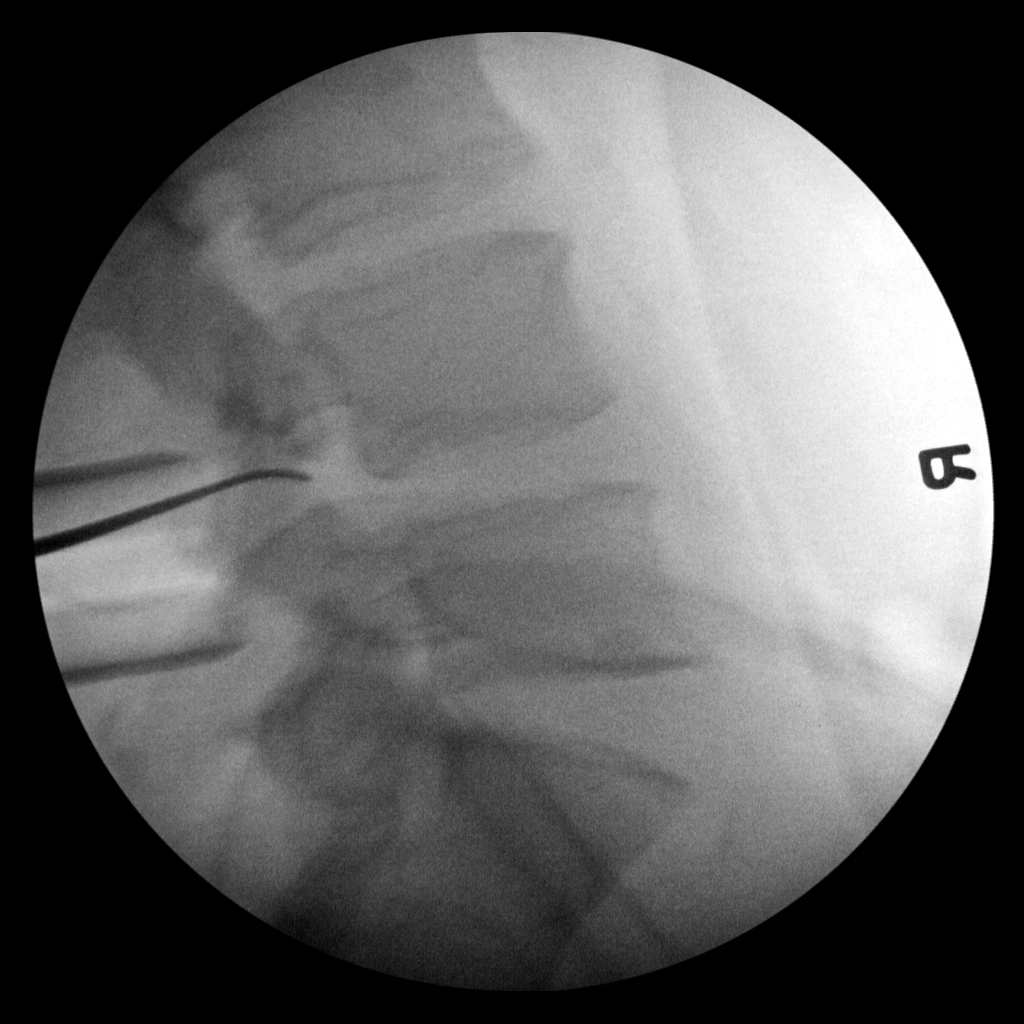

[3 of 3 positions shown; findings below may reference images not displayed]

FINDINGS: Two intraoperative lateral fluoroscopic spot views of the lower
lumbar spine. Normal lumbar segmentation on the comparison. These
images demonstrate surgical probes at the posterior L4-L5 level.

FLUOROSCOPY TIME:  0 minutes 14 seconds
IMPRESSION: Intraoperative localization at L4-L5.

## 2021-03-07 IMAGING — RF DG C-ARM 1-60 MIN
1 series · 3 of 3 positions shown · non-contrast
Comparison: [HOSPITAL] [facility] lumbar radiographs 10/02/2018.

CLINICAL DATA: 57-year-old male undergoing lumbar surgery.

EXAM:
LUMBAR SPINE - 1 VIEW; DG C-ARM 1-60 MIN

[Series 1: run · 3 of 3 slices shown]
[im 1/3]
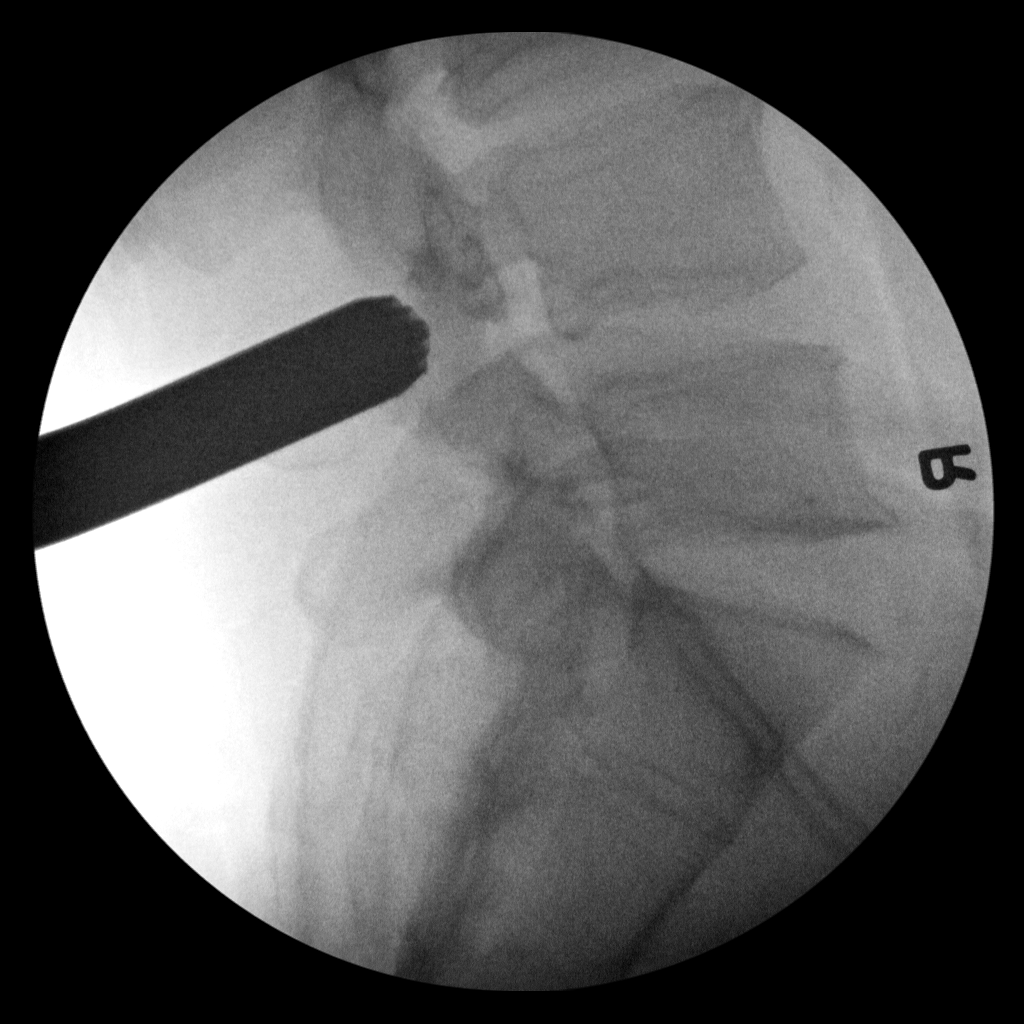
[im 2/3]
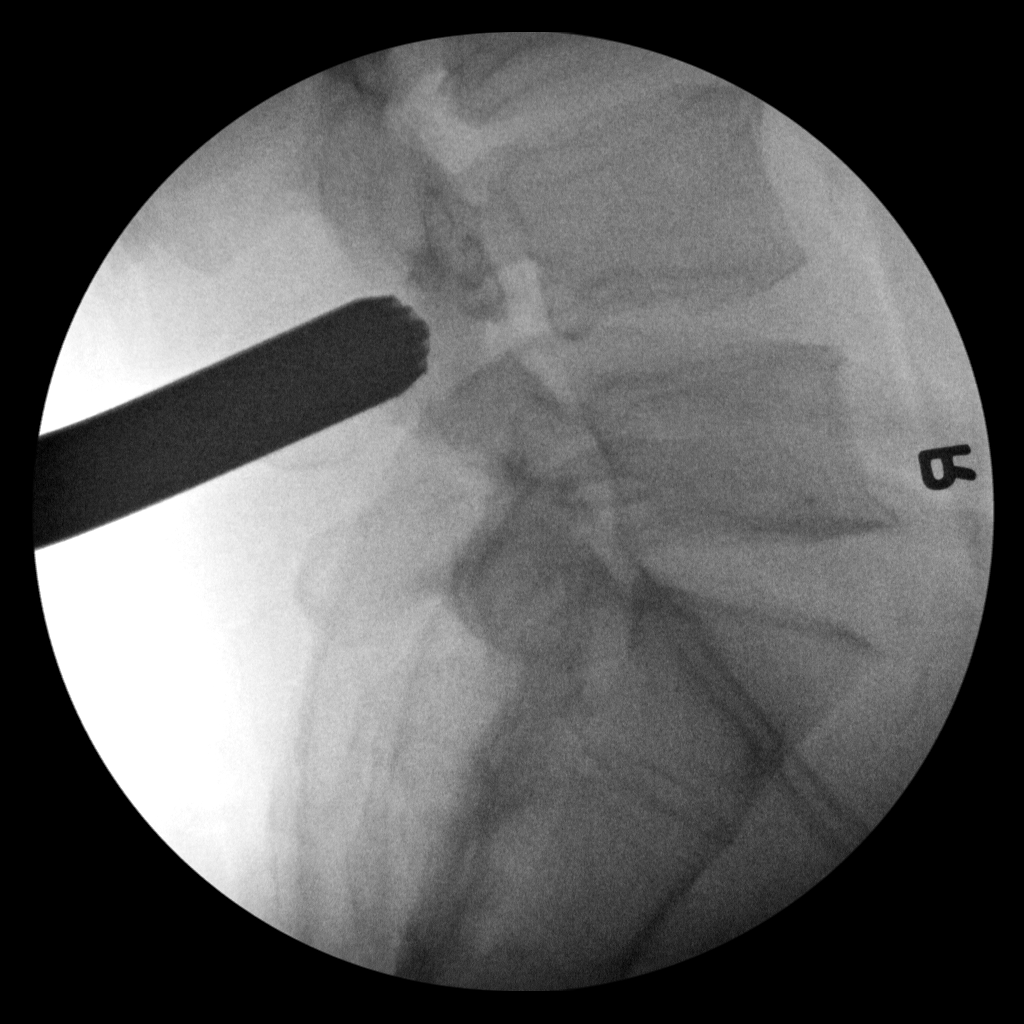
[im 3/3]
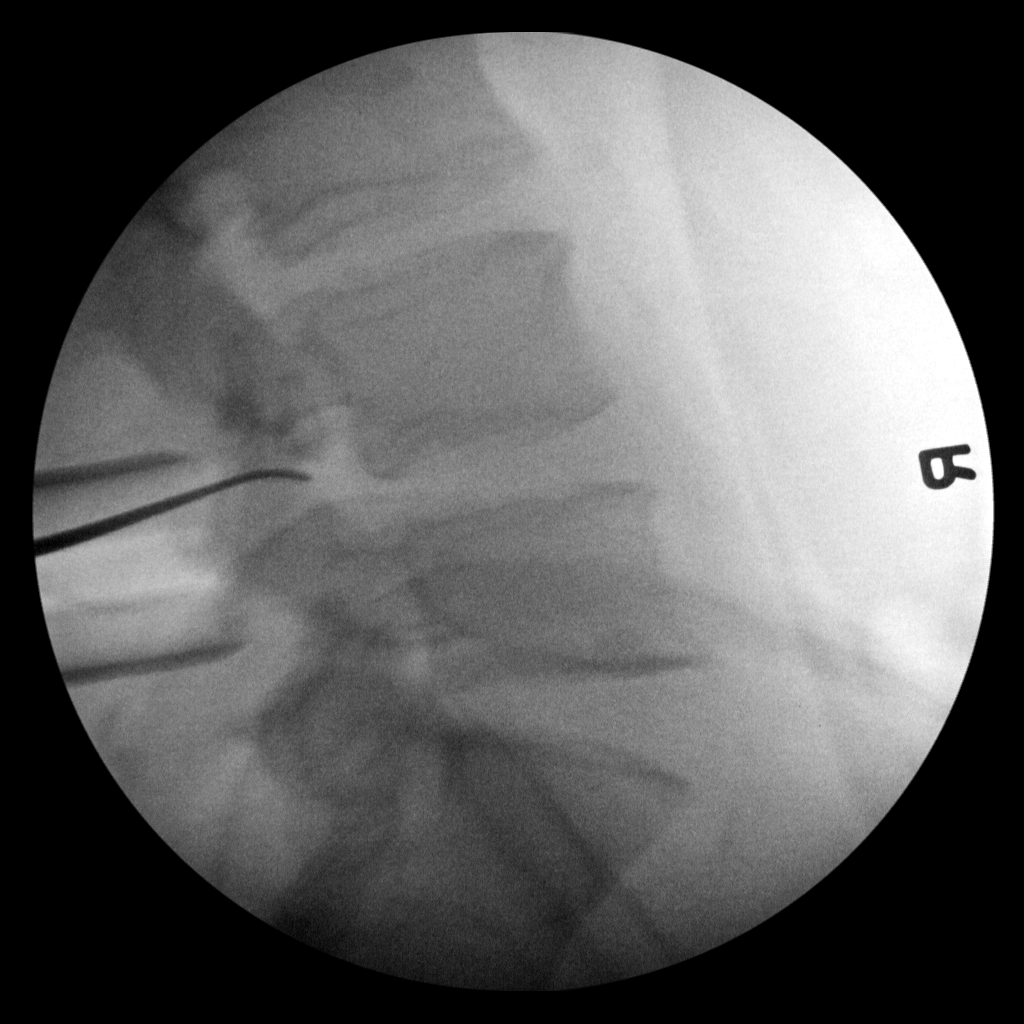

[3 of 3 positions shown; findings below may reference images not displayed]

FINDINGS: Two intraoperative lateral fluoroscopic spot views of the lower
lumbar spine. Normal lumbar segmentation on the comparison. These
images demonstrate surgical probes at the posterior L4-L5 level.

FLUOROSCOPY TIME:  0 minutes 14 seconds
IMPRESSION: Intraoperative localization at L4-L5.
